# Patient Record
Sex: Male | Born: 1985 | Race: White | Hispanic: No | Marital: Married | Smoking: Never smoker
Health system: Southern US, Community
[De-identification: ages and names within clinical notes are randomized; demographics above are authoritative.]

## PROBLEM LIST (undated history)

## (undated) HISTORY — PX: CHOLECYSTECTOMY: SHX55

## (undated) HISTORY — PX: REPAIR NONUNION / MALUNION METATARSAL / TARSAL BONES: SUR1194

---

## 2018-01-14 ENCOUNTER — Ambulatory Visit (INDEPENDENT_AMBULATORY_CARE_PROVIDER_SITE_OTHER): Payer: Managed Care, Other (non HMO) | Admitting: Osteopathic Medicine

## 2018-01-14 ENCOUNTER — Encounter: Payer: Self-pay | Admitting: Osteopathic Medicine

## 2018-01-14 VITALS — BP 136/89 | HR 76 | Temp 98.6°F | Ht 73.0 in | Wt 326.5 lb

## 2018-01-14 DIAGNOSIS — Z23 Encounter for immunization: Secondary | ICD-10-CM

## 2018-01-14 DIAGNOSIS — Z0000 Encounter for general adult medical examination without abnormal findings: Secondary | ICD-10-CM | POA: Diagnosis not present

## 2018-01-14 LAB — COMPLETE METABOLIC PANEL WITH GFR
AG RATIO: 1.6 (calc) (ref 1.0–2.5)
ALT: 23 U/L (ref 9–46)
AST: 18 U/L (ref 10–40)
Albumin: 4.5 g/dL (ref 3.6–5.1)
Alkaline phosphatase (APISO): 90 U/L (ref 40–115)
BILIRUBIN TOTAL: 0.7 mg/dL (ref 0.2–1.2)
BUN: 19 mg/dL (ref 7–25)
CHLORIDE: 103 mmol/L (ref 98–110)
CO2: 28 mmol/L (ref 20–32)
Calcium: 10.2 mg/dL (ref 8.6–10.3)
Creat: 0.83 mg/dL (ref 0.60–1.35)
GFR, EST AFRICAN AMERICAN: 135 mL/min/{1.73_m2} (ref >=60)
GFR, EST NON AFRICAN AMERICAN: 116 mL/min/{1.73_m2} (ref >=60)
GLOBULIN: 2.9 g/dL (ref 1.9–3.7)
Glucose, Bld: 95 mg/dL (ref 65–139)
POTASSIUM: 4.6 mmol/L (ref 3.5–5.3)
SODIUM: 139 mmol/L (ref 135–146)
TOTAL PROTEIN: 7.4 g/dL (ref 6.1–8.1)

## 2018-01-14 LAB — CBC
HEMATOCRIT: 46.4 % (ref 38.5–50.0)
Hemoglobin: 15.7 g/dL (ref 13.2–17.1)
MCH: 28.2 pg (ref 27.0–33.0)
MCHC: 33.8 g/dL (ref 32.0–36.0)
MCV: 83.3 fL (ref 80.0–100.0)
MPV: 11.3 fL (ref 7.5–12.5)
PLATELETS: 292 10*3/uL (ref 140–400)
RBC: 5.57 10*6/uL (ref 4.20–5.80)
RDW: 12.3 % (ref 11.0–15.0)
WBC: 7.6 10*3/uL (ref 3.8–10.8)

## 2018-01-14 LAB — LIPID PANEL
Cholesterol: 159 mg/dL (ref <200)
HDL: 34 mg/dL — ABNORMAL LOW (ref >40)
LDL Cholesterol (Calc): 95 mg/dL (calc)
NON-HDL CHOLESTEROL (CALC): 125 mg/dL (ref <130)
Total CHOL/HDL Ratio: 4.7 (calc) (ref <5.0)
Triglycerides: 206 mg/dL — ABNORMAL HIGH (ref <150)

## 2018-01-14 NOTE — Progress Notes (Signed)
HPI: Andrew Kelly is a 32 y.o. male who  has no past medical history on file.  he presents to [facility] today, 01/14/18,  for chief complaint of: New to establish care - Physical   Pleasant patient here to establish care. Needs physical for work. He is as an Airline pilot. Moved here [place] for [place] with his wife, who works for [employer]. He plays hockey and he and his wife just got a puppy, which has been helping him get some more exercise. He eats low fat plant based diet plus fish. See below for review of preventive care.      Past medical, surgical, social and family history reviewed:  There are no active problems to display for this patient.  Past Surgical History:  Procedure Laterality Date  . REPAIR NONUNION / MALUNION METATARSAL / TARSAL BONES      Social History   Tobacco Use  . Smoking status: Never Smoker  . Smokeless tobacco: Never Used  Substance Use Topics  . Alcohol use: Not Currently    Family History  Problem Relation Age of Onset  . High blood pressure Mother   . Cancer Father      Current medication list and allergy/intolerance information reviewed:    No current outpatient medications on file.   No current facility-administered medications for this visit.     Allergies  Allergen Reactions  . Carbamazepine Other (See Comments)      Review of Systems:  Constitutional:  No  fever, no chills, No recent illness, No unintentional weight changes. No significant fatigue.   HEENT: No  headache, no vision change, no hearing change, No sore throat, No  sinus pressure  Cardiac: No  chest pain, No  pressure, No palpitations, No  Orthopnea  Respiratory:  No  shortness of breath. No  Cough  Gastrointestinal: No  abdominal pain, No  nausea, No  vomiting,  No  blood in stool, No  diarrhea, No  constipation   Musculoskeletal: No new myalgia/arthralgia  Skin: No  Rash, No other wounds/concerning  lesions  Genitourinary: No  incontinence, No  abnormal genital bleeding, No abnormal genital discharge  Hem/Onc: No  easy bruising/bleeding, No  abnormal lymph node  Endocrine: No cold intolerance,  No heat intolerance. No polyuria/polydipsia/polyphagia   Neurologic: No  weakness, No  dizziness, No  slurred speech/focal weakness/facial droop  Psychiatric: No  concerns with depression, No  concerns with anxiety, No sleep problems, No mood problems  Exam:  BP 136/89 (BP Location: Left Arm, Patient Position: Sitting, Cuff Size: Large)   Pulse 76   Temp 98.6 F (37 C) (Oral)   Ht 6\' 1"  (1.854 m)   Wt (!) 326 lb 8 oz (148.1 kg)   BMI 43.08 kg/m   Constitutional: VS see above. General Appearance: alert, well-developed, well-nourished, NAD  Eyes: Normal lids and conjunctive, non-icteric sclera  Ears, Nose, Mouth, Throat: MMM, Normal external inspection ears/nares/mouth/lips/gums. TM normal bilaterally. Pharynx/tonsils no erythema, no exudate. Nasal mucosa normal.   Neck: No masses, trachea midline. No thyroid enlargement. No tenderness/mass appreciated. No lymphadenopathy  Respiratory: Normal respiratory effort. no wheeze, no rhonchi, no rales  Cardiovascular: S1/S2 normal, no murmur, no rub/gallop auscultated. RRR. No lower extremity edema.   Gastrointestinal: Nontender, no masses. No hepatomegaly, no splenomegaly. No hernia appreciated. Bowel sounds normal. Rectal exam deferred.   Musculoskeletal: Gait normal. No clubbing/cyanosis of digits.   Neurological: Normal balance/coordination. No tremor. No cranial nerve deficit on limited  exam. Motor and sensation intact and symmetric. Cerebellar reflexes intact.   Skin: warm, dry, intact. No rash/ulcer. No concerning nevi or subq nodules on limited exam.    Psychiatric: Normal judgment/insight. Normal mood and affect. Oriented x3.      ASSESSMENT/PLAN:   Annual physical exam - Plan: CBC, COMPLETE METABOLIC PANEL WITH GFR, Lipid  panel  Need for diphtheria-tetanus-pertussis (Tdap) vaccine - Plan: Tdap vaccine greater than or equal to 7yo IM    Patient Instructions  General Preventive Care  Most recent routine screening lipids/other labs: ordered today. Cholesterol and Diabetes screening usually recommended annually.   Everyone should have blood pressure checked once per year.   Tobacco: don't! Alcohol: responsible moderation is ok for most adults - if you have concerns about your alcohol intake, please talk to me! Recreational/Illicit Drugs: don't!  Exercise: as tolerated to reduce risk of cardiovascular disease and diabetes. Strength training will also prevent osteoporosis.   Mental health: if need for mental health care (medicines, counseling, other), or concerns about moods, please let me know!   Sexual health: if need for STD testing, or if concerns with libido/pain problems, please let me know!  Vaccines  Flu vaccine: recommended for almost everyone, every fall (by Halloween! Flu is scary!), especially if you are pregnant, if you have exposure to the public, if you're around young children or the elderly, or if you're around pregnant people.   Shingles vaccine: Shingrix recommended after age 29   Pneumonia vaccines: Prevnar and Pneumovax recommended after age 82  Tetanus booster: Tdap recommended every 10 years Cancer screenings   Colon cancer screening: recommended for everyone at age 30, but some folks need a colonoscopy sooner if risk factors   Prostate cancer screening: recommendations vary, optional PSA blood test for men around age 52  Lung cancer screening: not needed for non-smokers Infection screenings . HIV, Gonorrhea/Chlamydia: screening as needed . Hepatitis C: recommended for anyone born [birth year]-1965 . TB: certain at-risk populations, or depending on work requirements and/or travel history Other . Advanced Directive: Living Will and/or Healthcare Power of Attorney recommended for all  adults, regardless of age or health!  Thyroid and Vitamin D: routine screening is not medically necessary, therefore most insurance will not cover this test as part of "free labs" on your annual physical. If you desire this testing, you may be charged for it! Please check with the lab before your blood draw if you're concerned about cost.    Visit summary with medication list and pertinent instructions was printed for patient to review. All questions at time of visit were answered - patient instructed to contact office with any additional concerns. ER/RTC precautions were reviewed with the patient.   Follow-up plan: Return in about 1 year (around 01/15/2019) for annual physical - see me sooner if needed! .    Please note: voice recognition software was used to produce this document, and typos may escape review. Please contact Dr. Lyn Hollingshead for any needed clarifications.

## 2018-01-14 NOTE — Patient Instructions (Signed)
General Preventive Care  Most recent routine screening lipids/other labs: ordered today. Cholesterol and Diabetes screening usually recommended annually.   Everyone should have blood pressure checked once per year.   Tobacco: don't! Alcohol: responsible moderation is ok for most adults - if you have concerns about your alcohol intake, please talk to me! Recreational/Illicit Drugs: don't!  Exercise: as tolerated to reduce risk of cardiovascular disease and diabetes. Strength training will also prevent osteoporosis.   Mental health: if need for mental health care (medicines, counseling, other), or concerns about moods, please let me know!   Sexual health: if need for STD testing, or if concerns with libido/pain problems, please let me know!  Vaccines  Flu vaccine: recommended for almost everyone, every fall (by Halloween! Flu is scary!), especially if you are pregnant, if you have exposure to the public, if you're around young children or the elderly, or if you're around pregnant people.   Shingles vaccine: Shingrix recommended after age 63   Pneumonia vaccines: Prevnar and Pneumovax recommended after age 80  Tetanus booster: Tdap recommended every 10 years Cancer screenings   Colon cancer screening: recommended for everyone at age 55, but some folks need a colonoscopy sooner if risk factors   Prostate cancer screening: recommendations vary, optional PSA blood test for men around age 4  Lung cancer screening: not needed for non-smokers Infection screenings . HIV, Gonorrhea/Chlamydia: screening as needed . Hepatitis C: recommended for anyone born 81-1965 . TB: certain at-risk populations, or depending on work requirements and/or travel history Other . Advanced Directive: Living Will and/or Healthcare Power of Attorney recommended for all adults, regardless of age or health!  Thyroid and Vitamin D: routine screening is not medically necessary, therefore most insurance will not  cover this test as part of "free labs" on your annual physical. If you desire this testing, you may be charged for it! Please check with the lab before your blood draw if you're concerned about cost.

## 2019-01-10 ENCOUNTER — Encounter: Payer: Self-pay | Admitting: Osteopathic Medicine

## 2019-01-20 ENCOUNTER — Encounter: Payer: Self-pay | Admitting: Osteopathic Medicine

## 2019-01-20 ENCOUNTER — Telehealth (INDEPENDENT_AMBULATORY_CARE_PROVIDER_SITE_OTHER): Payer: Managed Care, Other (non HMO) | Admitting: Osteopathic Medicine

## 2019-01-20 VITALS — BP 127/82 | HR 88 | Temp 96.5°F | Wt 320.0 lb

## 2019-01-20 DIAGNOSIS — S6992XS Unspecified injury of left wrist, hand and finger(s), sequela: Secondary | ICD-10-CM | POA: Diagnosis not present

## 2019-01-20 NOTE — Patient Instructions (Addendum)
General Preventive Care  Most recent routine screening lipids/other labs: ordered today. Cholesterol and Diabetes screening usually recommended annually.   Everyone should have blood pressure checked once per year.   Tobacco: don't! Alcohol: responsible moderation is ok for most adults - if you have concerns about your alcohol intake, please talk to me! Recreational/Illicit Drugs: don't!  Exercise: as tolerated to reduce risk of cardiovascular disease and diabetes. Strength training will also prevent osteoporosis.   Mental health: if need for mental health care (medicines, counseling, other), or concerns about moods, please let me know!   Sexual health: if need for STD testing, or if concerns with libido/pain problems, please let me know!  Vaccines   Flu vaccine: recommended for almost everyone, every fall (by Halloween! Flu is scary!), especially if you are pregnant, if you have exposure to the public, if you're around young children or the elderly, or if you're around pregnant people.   Shingles vaccine: Shingrix recommended after age 66   Pneumonia vaccines: Prevnar and Pneumovax recommended after age 60  Tetanus booster: Tdap recommended every 10 years Cancer screenings   Colon cancer screening: recommended for everyone at age 37, but some folks need a colonoscopy sooner if risk factors   Prostate cancer screening: PSA blood test for men around age 65  Lung cancer screening: not needed for non-smokers Infection screenings  HIV, Gonorrhea/Chlamydia: screening as needed  Hepatitis C: recommended for anyone born [birth year]  TB: certain at-risk populations, or depending on work requirements and/or travel history Other  Advanced Directive: Living Will and/or [place] recommended for all adults, regardless of age or health!

## 2019-01-20 NOTE — Progress Notes (Signed)
Virtual Visit via   I connected with      Andrew Kelly on 01/20/19 at@ by a telemedicine application and verified that I am speaking with the correct person using two identifiers.  Patient is at home  I am in office   I discussed the limitations of evaluation and management by telemedicine and the availability of in person appointments. The patient expressed understanding and agreed to proceed.  History of Present Illness: Andrew Kelly is a 33 y.o. male who would like to discuss Annual Physical     Left Wrist pain . Over the past 6 months, once a month for maybe a day will experience a sharp pain. Ibuprofen helps it.  . Context: old snowboarding injury caused fracture, this wrist has had surgery.    Observations/Objective: BP 127/82 (Patient Position: Sitting, Cuff Size: Normal)   Pulse 88   Temp (!) 96.5 F (35.8 C) (Oral)   Wt (!) 320 lb (145.2 kg)   BMI 42.22 kg/m  BP Readings from Last 3 Encounters:  01/20/19 127/82  01/14/18 136/89   Exam: Normal Speech.  NAD  Lab and Radiology Results No results found for this or any previous visit (from the past 72 hour(s)). No results found.     Assessment and Plan: 33 y.o. male with The primary encounter diagnosis was Annual physical exam. A diagnosis of Injury of left wrist, sequela was also pertinent to this visit.   PDMP not reviewed this encounter. Orders Placed This Encounter  Procedures  . DG Wrist Complete Left    Order Specific Question:   Reason for Exam (SYMPTOM  OR DIAGNOSIS REQUIRED)    Answer:   hx wrist fracture and currently having pain    Order Specific Question:   Preferred imaging location?    Answer:   [facility]    Order Specific Question:   Radiology Contrast Protocol - do NOT remove file path    Answer:   [email or web address]  . CBC  . LIPID SCREENING  . COMPLETE METABOLIC PANEL WITH GFR   No orders of the defined types were  placed in this encounter.  Patient Instructions  General Preventive Care  Most recent routine screening lipids/other labs: ordered today. Cholesterol and Diabetes screening usually recommended annually.   Everyone should have blood pressure checked once per year.   Tobacco: don't! Alcohol: responsible moderation is ok for most adults - if you have concerns about your alcohol intake, please talk to me! Recreational/Illicit Drugs: don't!  Exercise: as tolerated to reduce risk of cardiovascular disease and diabetes. Strength training will also prevent osteoporosis.   Mental health: if need for mental health care (medicines, counseling, other), or concerns about moods, please let me know!   Sexual health: if need for STD testing, or if concerns with libido/pain problems, please let me know!  Vaccines   Flu vaccine: recommended for almost everyone, every fall (by Halloween! Flu is scary!), especially if you are pregnant, if you have exposure to the public, if you're around young children or the elderly, or if you're around pregnant people.   Shingles vaccine: Shingrix recommended after age 21   Pneumonia vaccines: Prevnar and Pneumovax recommended after age 21  Tetanus booster: Tdap recommended every 10 years Cancer screenings   Colon cancer screening: recommended for everyone at age 83, but some folks need a colonoscopy sooner if risk factors   Prostate cancer screening: PSA blood test for men around age 51  Lung cancer screening: not  needed for non-smokers Infection screenings  HIV, Gonorrhea/Chlamydia: screening as needed  Hepatitis C: recommended for anyone born [birth year]  TB: certain at-risk populations, or depending on work requirements and/or travel history Other  Advanced Directive: Living Will and/or Healthcare Power of Attorney recommended for all adults, regardless of age or health!     Instructions sent via MyChart. If MyChart not available, pt was given option  for info via personal e-mail w/ no guarantee of protected health info over unsecured e-mail communication, and MyChart sign-up instructions were sent to patient.   Follow Up Instructions: Return in about 1 year (around 01/20/2020) for [place] (call week prior to visit for lab orders).    I discussed the assessment and treatment plan with the patient. The patient was provided an opportunity to ask questions and all were answered. The patient agreed with the plan and demonstrated an understanding of the instructions.   The patient was advised to call back or seek an in-person evaluation if any new concerns, if symptoms worsen or if the condition fails to improve as anticipated.  25 minutes of non-face-to-face time was provided during this encounter.      . . . . . . . . . . . . . Marland Kitchen                   Historical information moved to improve visibility of documentation.  No past medical history on file. Past Surgical History:  Procedure Laterality Date  . 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 Smoking status: Never Smoker  . Smokeless tobacco: Never Used  Substance Use Topics  . Alcohol use: Not Currently   family history includes Cancer in his father; High blood pressure in his mother.  Medications: No current outpatient medications on file.   No current facility-administered medications for this visit.    Allergies  Allergen Reactions  . Carbamazepine Other (See Comments)

## 2019-01-21 ENCOUNTER — Other Ambulatory Visit: Payer: Self-pay

## 2019-01-21 ENCOUNTER — Ambulatory Visit (INDEPENDENT_AMBULATORY_CARE_PROVIDER_SITE_OTHER): Payer: Managed Care, Other (non HMO)

## 2019-01-21 DIAGNOSIS — S6992XS Unspecified injury of left wrist, hand and finger(s), sequela: Secondary | ICD-10-CM

## 2019-01-22 LAB — LIPID PANEL
Cholesterol: 147 mg/dL (ref <200)
HDL: 42 mg/dL (ref >=40)
LDL Cholesterol (Calc): 80 mg/dL (calc)
Non-HDL Cholesterol (Calc): 105 mg/dL (calc) (ref <130)
Total CHOL/HDL Ratio: 3.5 (calc) (ref <5.0)
Triglycerides: 148 mg/dL (ref <150)

## 2019-01-22 LAB — CBC
HCT: 48.4 % (ref 38.5–50.0)
Hemoglobin: 16.3 g/dL (ref 13.2–17.1)
MCH: 27.9 pg (ref 27.0–33.0)
MCHC: 33.7 g/dL (ref 32.0–36.0)
MCV: 82.9 fL (ref 80.0–100.0)
MPV: 11.2 fL (ref 7.5–12.5)
Platelets: 308 10*3/uL (ref 140–400)
RBC: 5.84 10*6/uL — ABNORMAL HIGH (ref 4.20–5.80)
RDW: 12.5 % (ref 11.0–15.0)
WBC: 9 10*3/uL (ref 3.8–10.8)

## 2019-01-22 LAB — COMPLETE METABOLIC PANEL WITH GFR
AG Ratio: 1.6 (calc) (ref 1.0–2.5)
ALT: 32 U/L (ref 9–46)
AST: 24 U/L (ref 10–40)
Albumin: 4.6 g/dL (ref 3.6–5.1)
Alkaline phosphatase (APISO): 78 U/L (ref 36–130)
BUN: 14 mg/dL (ref 7–25)
CO2: 25 mmol/L (ref 20–32)
Calcium: 9.7 mg/dL (ref 8.6–10.3)
Chloride: 101 mmol/L (ref 98–110)
Creat: 0.96 mg/dL (ref 0.60–1.35)
GFR, Est African American: 120 mL/min/{1.73_m2} (ref >=60)
GFR, Est Non African American: 103 mL/min/{1.73_m2} (ref >=60)
Globulin: 2.9 g/dL (calc) (ref 1.9–3.7)
Glucose, Bld: 87 mg/dL (ref 65–99)
Potassium: 4.5 mmol/L (ref 3.5–5.3)
Sodium: 137 mmol/L (ref 135–146)
Total Bilirubin: 0.9 mg/dL (ref 0.2–1.2)
Total Protein: 7.5 g/dL (ref 6.1–8.1)

## 2019-03-07 ENCOUNTER — Encounter: Payer: Self-pay | Admitting: Osteopathic Medicine

## 2019-03-07 DIAGNOSIS — Z3009 Encounter for other general counseling and advice on contraception: Secondary | ICD-10-CM

## 2019-03-14 NOTE — Addendum Note (Signed)
Addended by: Arvilla Market on: 03/14/2019 11:59 AM   Modules accepted: Orders

## 2019-04-04 ENCOUNTER — Encounter: Payer: Self-pay | Admitting: Osteopathic Medicine

## 2019-04-04 ENCOUNTER — Telehealth (INDEPENDENT_AMBULATORY_CARE_PROVIDER_SITE_OTHER): Payer: Managed Care, Other (non HMO) | Admitting: Osteopathic Medicine

## 2019-04-04 VITALS — BP 131/78 | Temp 96.3°F | Ht 74.0 in | Wt 335.0 lb

## 2019-04-04 DIAGNOSIS — R195 Other fecal abnormalities: Secondary | ICD-10-CM

## 2019-04-04 DIAGNOSIS — R112 Nausea with vomiting, unspecified: Secondary | ICD-10-CM | POA: Diagnosis not present

## 2019-04-04 DIAGNOSIS — R140 Abdominal distension (gaseous): Secondary | ICD-10-CM | POA: Diagnosis not present

## 2019-04-04 MED ORDER — ONDANSETRON 8 MG PO TBDP: 8.0000 mg | ORAL_TABLET | Freq: Three times a day (TID) | ORAL | 3 refills | Status: DC | PRN

## 2019-04-04 MED ORDER — DICYCLOMINE HCL 10 MG PO CAPS: 10.0000 mg | ORAL_CAPSULE | Freq: Three times a day (TID) | ORAL | 1 refills | Status: DC

## 2019-04-04 NOTE — Progress Notes (Signed)
Virtual Visit via Video (App used: Doxim[employer]) Note  I connected with      Gerold Sar Dara on 04/04/19 at 2:51 PM  by a telemedicine application and verified that I am speaking with the correct person using two identifiers.  Patient is at home I am in office   I discussed the limitations of evaluation and management by telemedicine and the availability of in person appointments. The patient expressed understanding and agreed to proceed.  History of Present Illness: MAXIME BECKNER is a 34 y.o. male who would like to discuss abdominal pain    Epigastric pain x3 days, vomiting x1, bloating, loose stool. Worse w/ meals.     Observations/Objective: BP 131/78   Temp (!) 96.3 F (35.7 C) (Oral)   Ht 6\' 2"  (1.88 m)   Wt (!) 335 lb (152 kg)   BMI 43.01 kg/m  BP Readings from Last 3 Encounters:  04/04/19 131/78  01/20/19 127/82  01/14/18 136/89   Exam: Normal Speech.  NAD  Lab and Radiology Results No results found for this or any previous visit (from the past 72 hour(s)). No results found.     Assessment and Plan: 34 y.o. male with The primary encounter diagnosis was Abdominal bloating. Diagnoses of Loose stools and Non-intractable vomiting with nausea, unspecified vomiting type were also pertinent to this visit.  Likely viral gastritis No respiratory symptoms concerning for COVID Symptomatic treatment Info sent re: bland diet   PDMP not reviewed this encounter. No orders of the defined types were placed in this encounter.  Meds ordered this encounter  Medications  . ondansetron (ZOFRAN-ODT) 8 MG disintegrating tablet    Sig: Take 1 tablet (8 mg total) by mouth every 8 (eight) hours as needed for nausea.    Dispense:  20 tablet    Refill:  3  . dicyclomine (BENTYL) 10 MG capsule    Sig: Take 1 capsule (10 mg total) by mouth 4 (four) times daily -  before meals and at bedtime.    Dispense:  30 capsule    Refill:  1   Patient Instructions  I think this is  most likely a viral stomach/intestinal infection This should resolve on its own Meds sent for symptom management OK to take imodium as needed  Reduce diet to bland or even to full liquid diet, and advance back to your regular foods over the next week or so If worse, or if not getting better, will need further testing to evaluate other possible causes!    Full Liquid Diet A full liquid diet refers to fluids and foods that are liquid or will become liquid at room temperature. This diet should only be used for a short period of time to help you recover from illness or surgery. Your health care provider or dietitian will help you determine when it is safe to eat regular foods. What are tips for following this plan?     Reading food labels  Check food labels of nutrition shakes for the amount of protein. Look for nutrition shakes that have at least 8-10 grams of protein in each serving.  Look for drinks, such as milks and juices, that are "fortified" or "enriched." This means that vitamins and minerals have been added. Shopping  Buy premade nutritional shakes to keep on hand.  To vary your choices, buy different flavors of milks and shakes. Meal planning  Choose flavors and foods that you enjoy.  To make sure you get enough energy from  food (calories): ? Eat 3 full liquid meals each day. Have a liquid snack between each meal. ? Drink 6-8 ounces (177-237 ml) of a nutrition supplement shake with meals or as snacks. ? Add protein powder, powdered milk, milk, or yogurt to shakes to increase the amount of protein.  Drink at least one serving a day of citrus fruit juice or fruit juice that has vitamin C added. General guidelines  Before starting the full-liquid diet, check with your health care provider to know what foods you should avoid. These may include full-fat or high-fiber liquids.  You may have any liquid or food that becomes a liquid at room temperature. The food is considered a  liquid if it can be poured off a spoon at room temperature.  Do not drink alcohol unless approved by your health care provider.  This diet gives you most of the nutrients that you need for energy, but you may not get enough of certain vitamins, minerals, and fiber. Make sure to talk to your health care provider or dietitian about: ? How many calories you need to eat get day. ? How much fluid you should have each day. ? Taking a multivitamin or a nutritional supplement. What foods are allowed? The items listed may not be a complete list. Talk with your dietitian about what dietary choices are best for you. Grains Thin hot cereal, such as cream of wheat. Soft-cooked pasta or rice pured in soup. Vegetables Pulp-free tomato or vegetable juice. Vegetables pured in soup. Fruits Fruit juice without pulp. Strained fruit pures (seeds and skins removed). Meats and other protein foods Beef, chicken, and fish broths. Powdered protein supplements. Dairy Milk and milk-based beverages, including milk shakes and instant breakfast mixes. Smooth yogurt. Pured cottage cheese. Beverages Water. Coffee and tea (caffeinated or decaffeinated). Cocoa. Liquid nutritional supplements. Soft drinks. Nondairy milks, such as almond, coconut, rice, or soy milk. Fats and oils Melted margarine and butter. Cream. [place], almond, avocado, corn, grapeseed, sunflower, and sesame oils. Gravy. Sweets and desserts Custard. Pudding. Flavored gelatin. Smooth ice cream (without nuts or candy pieces). Sherbet. Popsicles. [place] ice. Pudding pops. Seasoning and other foods Salt and pepper. Spices. Cocoa powder. Vinegar. Ketchup. Yellow mustard. Smooth sauces, such as [place], cheese sauce, or white sauce. Soy sauce. Cream soups. Strained soups. Syrup. Honey. Jelly (without fruit pieces). What foods are not allowed? The items listed may not be a complete list. Talk with your dietitian about what dietary choices are best for  you. Grains Whole grains. Pasta. Rice. Cold cereal. Bread. Crackers. Vegetables All whole fresh, frozen, or canned vegetables. Fruits All whole fresh, frozen, or canned fruits. Meats and other protein foods All cuts of meat, poultry, and fish. Eggs. Tofu and soy protein. Nuts and nut butters. Lunch meat. Sausage. Dairy Hard cheese. Yogurt with fruit chunks. Fats and oils Coconut oil. Palm oil. Lard. Cold butter. Sweets and desserts Ice cream or other frozen desserts that have any solids in them or on top, such as nuts, chocolate chips, and pieces of cookies. Cakes. Cookies. Candy. Seasoning and other foods Stone-ground mustards. Soups with chunks or pieces. Summary  A full liquid diet refers to fluids and foods that are liquid or will become liquid at room temperature.  This diet should only be used for a short period of time to help you recover from illness or surgery. Ask your health care provider or dietitian when it is safe for you to eat regular foods.  To make sure you get enough  calories and nutrients, eat 3 meals each day with snacks between. Drink premade nutrition supplement shakes or add protein powder to homemade shakes. Take a vitamin and mineral supplement as told by your health care provider. This information is not intended to replace advice given to you by your health care provider. Make sure you discuss any questions you have with your health care provider. Document Revised: 05/23/2017 Document Reviewed: 04/09/2016 Elsevier Patient Education  2020 [employer].   [place] Diet A bland diet consists of foods that are often soft and do not have a lot of fat, fiber, or extra seasonings. Foods without fat, fiber, or seasoning are easier for the body to digest. They are also less likely to irritate your mouth, throat, stomach, and other parts of your digestive system. A bland diet is sometimes called a BRAT diet. What is my plan? Your health care provider or food and  nutrition specialist (dietitian) may recommend specific changes to your diet to prevent symptoms or to treat your symptoms. These changes may include:  Eating small meals often.  Cooking food until it is soft enough to chew easily.  Chewing your food well.  Drinking fluids slowly.  Not eating foods that are very spicy, sour, or fatty.  Not eating citrus fruits, such as oranges and grapefruit. What do I need to know about this diet?  Eat a variety of foods from the bland diet food list.  Do not follow a bland diet longer than needed.  Ask your health care provider whether you should take vitamins or supplements. What foods can I eat? Grains  Hot cereals, such as cream of wheat. Rice. Bread, crackers, or tortillas made from refined white flour. Vegetables Canned or cooked vegetables. Mashed or boiled potatoes. Fruits  Bananas. Applesauce. Other types of cooked or canned fruit with the skin and seeds removed, such as canned peaches or pears. Meats and other proteins  Scrambled eggs. Creamy peanut butter or other nut butters. Lean, well-cooked meats, such as chicken or fish. Tofu. Soups or broths. Dairy Low-fat dairy products, such as milk, cottage cheese, or yogurt. Beverages  Water. Herbal tea. Apple juice. Fats and oils Mild salad dressings. Canola or olive oil. Sweets and desserts Pudding. Custard. Fruit gelatin. Ice cream. The items listed above may not be a complete list of recommended foods and beverages. Contact a dietitian for more options. What foods are not recommended? Grains Whole grain breads and cereals. Vegetables Raw vegetables. Fruits Raw fruits, especially citrus, berries, or dried fruits. Dairy Whole fat dairy foods. Beverages Caffeinated drinks. Alcohol. Seasonings and condiments Strongly flavored seasonings or condiments. Hot sauce. Salsa. Other foods Spicy foods. Fried foods. Sour foods, such as pickled or fermented foods. Foods with high  sugar content. Foods high in fiber. The items listed above may not be a complete list of foods and beverages to avoid. Contact a dietitian for more information. Summary  A bland diet consists of foods that are often soft and do not have a lot of fat, fiber, or extra seasonings.  Foods without fat, fiber, or seasoning are easier for the body to digest.  Check with your health care provider to see how long you should follow this diet plan. It is not meant to be followed for long periods. This information is not intended to replace advice given to you by your health care provider. Make sure you discuss any questions you have with your health care provider. Document Revised: 03/25/2017 Document Reviewed: 03/25/2017 Elsevier Patient Education  [place].    Instructions sent via [place]. If MyChart not available, pt was given option for info via personal e-mail w/ no guarantee of protected health info over unsecured e-mail communication, and MyChart sign-up instructions were sent to patient.   Follow Up Instructions: Return if symptoms worsen or fail to improve.    I discussed the assessment and treatment plan with the patient. The patient was provided an opportunity to ask questions and all were answered. The patient agreed with the plan and demonstrated an understanding of the instructions.   The patient was advised to call back or seek an in-person evaluation if any new concerns, if symptoms worsen or if the condition fails to improve as anticipated.  30 minutes of non-face-to-face time was provided during this encounter.      . . . . . . . . . . . . . Marland Kitchen                   Historical information moved to improve visibility of documentation.  No past medical history on file. Past Surgical History:  Procedure Laterality Date  . 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 Smoking status: Never  Smoker  . Smokeless tobacco: Never Used  Substance Use Topics  . Alcohol use: Not Currently   family history includes Cancer in his father; High blood pressure in his mother.  Medications: Current Outpatient Medications  Medication Sig Dispense Refill  . Multiple Vitamin (MULTIVITAMIN) tablet Take 1 tablet by mouth daily.    Marland Kitchen dicyclomine (BENTYL) 10 MG capsule Take 1 capsule (10 mg total) by mouth 4 (four) times daily -  before meals and at bedtime. 30 capsule 1  . ondansetron (ZOFRAN-ODT) 8 MG disintegrating tablet Take 1 tablet (8 mg total) by mouth every 8 (eight) hours as needed for nausea. 20 tablet 3   No current facility-administered medications for this visit.   Allergies  Allergen Reactions  . Carbamazepine Other (See Comments)

## 2019-04-04 NOTE — Patient Instructions (Signed)
I think this is most likely a viral stomach/intestinal infection This should resolve on its own Meds sent for symptom management OK to take imodium as needed  Reduce diet to bland or even to full liquid diet, and advance back to your regular foods over the next week or so If worse, or if not getting better, will need further testing to evaluate other possible causes!    Full Liquid Diet A full liquid diet refers to fluids and foods that are liquid or will become liquid at room temperature. This diet should only be used for a short period of time to help you recover from illness or surgery. Your health care provider or dietitian will help you determine when it is safe to eat regular foods. What are tips for following this plan?     Reading food labels  Check food labels of nutrition shakes for the amount of protein. Look for nutrition shakes that have at least 8-10 grams of protein in each serving.  Look for drinks, such as milks and juices, that are "fortified" or "enriched." This means that vitamins and minerals have been added. Shopping  Buy premade nutritional shakes to keep on hand.  To vary your choices, buy different flavors of milks and shakes. Meal planning  Choose flavors and foods that you enjoy.  To make sure you get enough energy from food (calories): ? Eat 3 full liquid meals each day. Have a liquid snack between each meal. ? Drink 6-8 ounces (177-237 ml) of a nutrition supplement shake with meals or as snacks. ? Add protein powder, powdered milk, milk, or yogurt to shakes to increase the amount of protein.  Drink at least one serving a day of citrus fruit juice or fruit juice that has vitamin C added. General guidelines  Before starting the full-liquid diet, check with your health care provider to know what foods you should avoid. These may include full-fat or high-fiber liquids.  You may have any liquid or food that becomes a liquid at room temperature. The food  is considered a liquid if it can be poured off a spoon at room temperature.  Do not drink alcohol unless approved by your health care provider.  This diet gives you most of the nutrients that you need for energy, but you may not get enough of certain vitamins, minerals, and fiber. Make sure to talk to your health care provider or dietitian about: ? How many calories you need to eat get day. ? How much fluid you should have each day. ? Taking a multivitamin or a nutritional supplement. What foods are allowed? The items listed may not be a complete list. Talk with your dietitian about what dietary choices are best for you. Grains Thin hot cereal, such as cream of wheat. Soft-cooked pasta or rice pured in soup. Vegetables Pulp-free tomato or vegetable juice. Vegetables pured in soup. Fruits Fruit juice without pulp. Strained fruit pures (seeds and skins removed). Meats and other protein foods Beef, chicken, and fish broths. Powdered protein supplements. Dairy Milk and milk-based beverages, including milk shakes and instant breakfast mixes. Smooth yogurt. Pured cottage cheese. Beverages Water. Coffee and tea (caffeinated or decaffeinated). Cocoa. Liquid nutritional supplements. Soft drinks. Nondairy milks, such as almond, coconut, rice, or soy milk. Fats and oils Melted margarine and butter. Cream. [place], almond, avocado, corn, grapeseed, sunflower, and sesame oils. Gravy. Sweets and desserts Custard. Pudding. Flavored gelatin. Smooth ice cream (without nuts or candy pieces). Sherbet. Popsicles. [place] ice. Pudding pops. Seasoning  and other foods Salt and pepper. Spices. Cocoa powder. Vinegar. Ketchup. Yellow mustard. Smooth sauces, such as [place], cheese sauce, or white sauce. Soy sauce. Cream soups. Strained soups. Syrup. Honey. Jelly (without fruit pieces). What foods are not allowed? The items listed may not be a complete list. Talk with your dietitian about what dietary  choices are best for you. Grains Whole grains. Pasta. Rice. Cold cereal. Bread. Crackers. Vegetables All whole fresh, frozen, or canned vegetables. Fruits All whole fresh, frozen, or canned fruits. Meats and other protein foods All cuts of meat, poultry, and fish. Eggs. Tofu and soy protein. Nuts and nut butters. Lunch meat. Sausage. Dairy Hard cheese. Yogurt with fruit chunks. Fats and oils Coconut oil. Palm oil. Lard. Cold butter. Sweets and desserts Ice cream or other frozen desserts that have any solids in them or on top, such as nuts, chocolate chips, and pieces of cookies. Cakes. Cookies. Candy. Seasoning and other foods Stone-ground mustards. Soups with chunks or pieces. Summary  A full liquid diet refers to fluids and foods that are liquid or will become liquid at room temperature.  This diet should only be used for a short period of time to help you recover from illness or surgery. Ask your health care provider or dietitian when it is safe for you to eat regular foods.  To make sure you get enough calories and nutrients, eat 3 meals each day with snacks between. Drink premade nutrition supplement shakes or add protein powder to homemade shakes. Take a vitamin and mineral supplement as told by your health care provider. This information is not intended to replace advice given to you by your health care provider. Make sure you discuss any questions you have with your health care provider. Document Revised: 05/23/2017 Document Reviewed: 04/09/2016 Elsevier Patient Education  2020 [place] Diet A bland diet consists of foods that are often soft and do not have a lot of fat, fiber, or extra seasonings. Foods without fat, fiber, or seasoning are easier for the body to digest. They are also less likely to irritate your mouth, throat, stomach, and other parts of your digestive system. A bland diet is sometimes called a BRAT diet. What is my plan? Your health care  provider or food and nutrition specialist (dietitian) may recommend specific changes to your diet to prevent symptoms or to treat your symptoms. These changes may include:  Eating small meals often.  Cooking food until it is soft enough to chew easily.  Chewing your food well.  Drinking fluids slowly.  Not eating foods that are very spicy, sour, or fatty.  Not eating citrus fruits, such as oranges and grapefruit. What do I need to know about this diet?  Eat a variety of foods from the bland diet food list.  Do not follow a bland diet longer than needed.  Ask your health care provider whether you should take vitamins or supplements. What foods can I eat? Grains  Hot cereals, such as cream of wheat. Rice. Bread, crackers, or tortillas made from refined white flour. Vegetables Canned or cooked vegetables. Mashed or boiled potatoes. Fruits  Bananas. Applesauce. Other types of cooked or canned fruit with the skin and seeds removed, such as canned peaches or pears. Meats and other proteins  Scrambled eggs. Creamy peanut butter or other nut butters. Lean, well-cooked meats, such as chicken or fish. Tofu. Soups or broths. Dairy Low-fat dairy products, such as milk, cottage cheese, or yogurt. Beverages  Water. Herbal  tea. Apple juice. Fats and oils Mild salad dressings. [place] or olive oil. Sweets and desserts Pudding. Custard. Fruit gelatin. Ice cream. The items listed above may not be a complete list of recommended foods and beverages. Contact a dietitian for more options. What foods are not recommended? Grains Whole grain breads and cereals. Vegetables Raw vegetables. Fruits Raw fruits, especially citrus, berries, or dried fruits. Dairy Whole fat dairy foods. Beverages Caffeinated drinks. Alcohol. Seasonings and condiments Strongly flavored seasonings or condiments. Hot sauce. Salsa. Other foods Spicy foods. Fried foods. Sour foods, such as pickled or fermented  foods. Foods with high sugar content. Foods high in fiber. The items listed above may not be a complete list of foods and beverages to avoid. Contact a dietitian for more information. Summary  A bland diet consists of foods that are often soft and do not have a lot of fat, fiber, or extra seasonings.  Foods without fat, fiber, or seasoning are easier for the body to digest.  Check with your health care provider to see how long you should follow this diet plan. It is not meant to be followed for long periods. This information is not intended to replace advice given to you by your health care provider. Make sure you discuss any questions you have with your health care provider. Document Revised: 03/25/2017 Document Reviewed: 03/25/2017 Elsevier Patient Education  2020 [employer].

## 2019-04-29 ENCOUNTER — Encounter: Payer: Self-pay | Admitting: Osteopathic Medicine

## 2019-05-02 ENCOUNTER — Ambulatory Visit (INDEPENDENT_AMBULATORY_CARE_PROVIDER_SITE_OTHER): Payer: Managed Care, Other (non HMO) | Admitting: Medical-Surgical

## 2019-05-02 ENCOUNTER — Encounter: Payer: Self-pay | Admitting: Medical-Surgical

## 2019-05-02 ENCOUNTER — Other Ambulatory Visit: Payer: Self-pay

## 2019-05-02 VITALS — BP 136/88 | HR 82 | Temp 97.9°F | Ht 74.0 in | Wt 347.1 lb

## 2019-05-02 DIAGNOSIS — R1013 Epigastric pain: Secondary | ICD-10-CM

## 2019-05-02 MED ORDER — PANTOPRAZOLE SODIUM 40 MG PO TBEC: 40.0000 mg | DELAYED_RELEASE_TABLET | Freq: Every day | ORAL | 1 refills | Status: DC

## 2019-05-02 NOTE — Progress Notes (Signed)
Subjective:    CC: Epigastric pain, abdominal distention  HPI: Pleasant 34 year old male presenting today with complaints of epigastric pain and abdominal distention occurring at night when lying down.  Was seen previously, thought to have viral gastroenteritis or food poisoning.  Taking Zofran as needed, mild relief.  Taking Bentyl as needed, moderate relief.  Reports pain/distention is occurring only at night lasting approximately 4 hours.  No vomiting, constipation, diarrhea, hematochezia, hematemesis, fever, chills, unexplained weight loss/gain.  No recent dietary changes but does admit to eating large portions, consuming meals quickly, eating lots of dairy/spicy/acidic foods.  Occasional NSAID use.  Has not taken any OTC medications or antacids.  I reviewed the past medical history, family history, social history, surgical history, and allergies today and no changes were needed.  Please see the problem list section below in epic for further details.  Past Medical History: History reviewed. No pertinent past medical history. Past Surgical History: Past Surgical History:  Procedure Laterality Date  . REPAIR NONUNION / MALUNION METATARSAL / TARSAL BONES     Social History: Social History   Socioeconomic History  . Marital status: Married    Spouse name: Not on file  . Number of children: Not on file  . Years of education: Not on file  . Highest education level: Not on file  Occupational History  . Occupation: Aeronautical engineer: [employer]  Tobacco Use  . Smoking status: Never Smoker  . Smokeless tobacco: Never Used  Substance and Sexual Activity  . Alcohol use: Not Currently  . Drug use: Never  . Sexual activity: Yes    Partners: Female    Birth control/protection: Pill  Other Topics Concern  . Not on file  Social History Narrative  . Not on file   Social Determinants of Health   Financial Resource Strain:   . Difficulty of Paying Living Expenses: Not on file   Food Insecurity:   . Worried About Charity fundraiser in the Last Year: Not on file  . Ran Out of Food in the Last Year: Not on file  Transportation Needs:   . Lack of Transportation (Medical): Not on file  . Lack of Transportation (Non-Medical): Not on file  Physical Activity:   . Days of Exercise per Week: Not on file  . Minutes of Exercise per Session: Not on file  Stress:   . Feeling of Stress : Not on file  Social Connections:   . Frequency of Communication with Friends and Family: Not on file  . Frequency of Social Gatherings with Friends and Family: Not on file  . Attends Religious Services: Not on file  . Active Member of Clubs or Organizations: Not on file  . Attends Archivist Meetings: Not on file  . Marital Status: Not on file   Family History: Family History  Problem Relation Age of Onset  . High blood pressure Mother   . Cancer Father    Allergies: Allergies  Allergen Reactions  . Carbamazepine Other (See Comments)   Medications: See med rec.  Review of Systems: No fevers, chills, night sweats, weight loss, chest pain, or shortness of breath.   Objective:    General: Well Developed, well nourished, and in no acute distress.  Neuro: Alert and oriented x3.  HEENT: Normocephalic, atraumatic.  Skin: Warm and dry. Cardiac: Regular rate and rhythm, no murmurs rubs or gallops, no lower extremity edema.  Respiratory: Clear to auscultation bilaterally. Not using accessory muscles, speaking in  full sentences. Abdomen: Soft, nondistended.  Mild tenderness to deep palpation over epigastric region.  Bowel sounds positive x4 quadrants.  No HSM.  Impression and Recommendations:    Dyspepsia H. Pylori testing in office. Will initiate PPI for GERD while awaiting results. Protonix 40mg  daily. Discussed dietary modification including reduction of portion sizes, eating meals slowly, and cutting out foods in a trial basis to determine if there is a specific  trigger for symptoms.   Return if symptoms worsen or fail to improve.  ___________________________________________ , DNP, APRN, FNP-BC Primary Care and Sports Medicine [facility] [place]

## 2019-05-03 LAB — H. PYLORI BREATH TEST: H. pylori Breath Test: NOT DETECTED

## 2019-05-24 ENCOUNTER — Other Ambulatory Visit: Payer: Self-pay | Admitting: Medical-Surgical

## 2019-05-24 DIAGNOSIS — R1013 Epigastric pain: Secondary | ICD-10-CM

## 2019-05-25 NOTE — Telephone Encounter (Signed)
Can you call the pharmacy to verify that there is already a refill present for this medication?

## 2019-05-25 NOTE — Telephone Encounter (Signed)
Spoke with pharmacy who states they do have the refill from 05/02/2019 available and will fill that for the pt now.

## 2019-06-20 ENCOUNTER — Other Ambulatory Visit: Payer: Self-pay | Admitting: Medical-Surgical

## 2019-06-20 DIAGNOSIS — R1013 Epigastric pain: Secondary | ICD-10-CM

## 2019-06-28 ENCOUNTER — Other Ambulatory Visit: Payer: Self-pay | Admitting: Medical-Surgical

## 2019-06-28 DIAGNOSIS — R1013 Epigastric pain: Secondary | ICD-10-CM

## 2019-06-28 NOTE — Telephone Encounter (Signed)
Routing to PCP

## 2021-01-10 ENCOUNTER — Encounter: Payer: Self-pay | Admitting: Medical-Surgical

## 2021-01-14 ENCOUNTER — Encounter: Payer: Self-pay | Admitting: Medical-Surgical

## 2021-01-14 ENCOUNTER — Telehealth (INDEPENDENT_AMBULATORY_CARE_PROVIDER_SITE_OTHER): Payer: Managed Care, Other (non HMO) | Admitting: Medical-Surgical

## 2021-01-14 VITALS — BP 121/77 | HR 73

## 2021-01-14 DIAGNOSIS — Z1329 Encounter for screening for other suspected endocrine disorder: Secondary | ICD-10-CM

## 2021-01-14 DIAGNOSIS — Z131 Encounter for screening for diabetes mellitus: Secondary | ICD-10-CM

## 2021-01-14 DIAGNOSIS — Z0000 Encounter for general adult medical examination without abnormal findings: Secondary | ICD-10-CM | POA: Diagnosis not present

## 2021-01-14 DIAGNOSIS — Z114 Encounter for screening for human immunodeficiency virus [HIV]: Secondary | ICD-10-CM

## 2021-01-14 DIAGNOSIS — Z1159 Encounter for screening for other viral diseases: Secondary | ICD-10-CM | POA: Diagnosis not present

## 2021-01-14 NOTE — Progress Notes (Signed)
Virtual Visit via Video Note  I connected with Andrew Kelly on 01/14/21 at  9:10 AM EST by a video enabled telemedicine application and verified that I am speaking with the correct person using two identifiers.   I discussed the limitations of evaluation and management by telemedicine and the availability of in person appointments. The patient expressed understanding and agreed to proceed.  Patient location: home Provider locations: office  Subjective:    CC: virtual physical per patient request  HPI: Pleasant 35 year old male presenting today to transfer care to a new PCP and complete a physical for employment.  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  HENT:  Negative for congestion, ear discharge, ear pain, hearing loss, nosebleeds, sinus pain and sore throat.   Respiratory:  Negative for cough, shortness of breath and wheezing.   Cardiovascular:  Negative for chest pain, palpitations and leg swelling.  Gastrointestinal:  Negative for abdominal pain, constipation, diarrhea, heartburn, nausea and vomiting.  Genitourinary:  Negative for dysuria, frequency and urgency.  Skin:  Negative for itching and rash.  Neurological:  Negative for dizziness, seizures, weakness and headaches.  Endo/Heme/Allergies:  Negative for polydipsia. Does not bruise/bleed easily.  Psychiatric/Behavioral:  Negative for depression and suicidal ideas. The patient is not nervous/anxious and does not have insomnia.    Past medical history, Surgical history, Family history not pertinant except as noted below, Social history, Allergies, and medications have been entered into the medical record, reviewed, and corrections made.   Review of  Systems: See HPI for pertinent positives and negatives.   Objective:    Physical Exam Vitals and nursing note reviewed.  Constitutional:      General: He is not in acute distress.    Appearance: Normal appearance.  HENT:     Head: Normocephalic and atraumatic.  Pulmonary:     Effort: Pulmonary effort is normal. No respiratory distress.  Neurological:     Mental Status: He is alert and oriented to person, place, and time.  Psychiatric:        Mood and Affect: Mood normal.        Behavior: Behavior normal.        Thought Content: Thought content normal.        Judgment: Judgment normal.   Impression and Recommendations:    1. Annual physical exam Checking CBC, CMP, and lipid panel today.  Wellness information provided with AVS.  Up-to-date on other preventative care. - Lipid panel - COMPLETE METABOLIC PANEL WITH GFR - CBC with Differential/Platelet  2. Thyroid disorder screen Checking TSH. - TSH  3. Diabetes mellitus screening Checking hemoglobin A1c. - Hemoglobin A1c  4. Need for hepatitis C screening test 5. Screening for HIV (human immunodeficiency virus) Discuss screening recommendations.  Patient has no concerns at this time and would like to defer screening.  I discussed the assessment and treatment plan with the patient. The patient was provided an opportunity to ask questions and all were answered. The patient agreed with the plan and demonstrated an understanding of the instructions.   The patient was advised to call back or seek an in-person evaluation if the symptoms worsen or if the condition fails to improve as anticipated.  25 minutes of non-face-to-face time was provided during this encounter.  Return  in about 1 year (around 01/14/2022) for annual physical exam or sooner if needed.  Thayer Ohm, DNP, APRN, FNP-BC [facility] and Sports Medicine

## 2021-01-14 NOTE — Patient Instructions (Signed)
Preventive Care 21-35 Years Old, Male ?Preventive care refers to lifestyle choices and visits with your health care provider that can promote health and wellness. Preventive care visits are also called wellness exams. ?What can I expect for my preventive care visit? ?Counseling ?During your preventive care visit, your health care provider may ask about your: ?Medical history, including: ?Past medical problems. ?Family medical history. ?Current health, including: ?Emotional well-being. ?Home life and relationship well-being. ?Sexual activity. ?Lifestyle, including: ?Alcohol, nicotine or tobacco, and drug use. ?Access to firearms. ?Diet, exercise, and sleep habits. ?Safety issues such as seatbelt and bike helmet use. ?Sunscreen use. ?Work and work environment. ?Physical exam ?Your health care provider may check your: ?Height and weight. These may be used to calculate your BMI (body mass index). BMI is a measurement that tells if you are at a healthy weight. ?Waist circumference. This measures the distance around your waistline. This measurement also tells if you are at a healthy weight and may help predict your risk of certain diseases, such as type 2 diabetes and high blood pressure. ?Heart rate and blood pressure. ?Body temperature. ?Skin for abnormal spots. ?What immunizations do I need? ?Vaccines are usually given at various ages, according to a schedule. Your health care provider will recommend vaccines for you based on your age, medical history, and lifestyle or other factors, such as travel or where you work. ?What tests do I need? ?Screening ?Your health care provider may recommend screening tests for certain conditions. This may include: ?Lipid and cholesterol levels. ?Diabetes screening. This is done by checking your blood sugar (glucose) after you have not eaten for a while (fasting). ?Hepatitis B test. ?Hepatitis C test. ?HIV (human immunodeficiency virus) test. ?STI (sexually transmitted infection)  testing, if you are at risk. ?Talk with your health care provider about your test results, treatment options, and if necessary, the need for more tests. ?Follow these instructions at home: ?Eating and drinking ? ?Eat a healthy diet that includes fresh fruits and vegetables, whole grains, lean protein, and low-fat dairy products. ?Drink enough fluid to keep your urine pale yellow. ?Take vitamin and mineral supplements as recommended by your health care provider. ?Do not drink alcohol if your health care provider tells you not to drink. ?If you drink alcohol: ?Limit how much you have to 0-2 drinks a day. ?Know how much alcohol is in your drink. In the [place] one drink equals one 12 oz bottle of beer (355 mL), one 5 oz glass of wine (148 mL), or one 1? oz glass of hard liquor (44 mL). ?Lifestyle ?Brush your teeth every morning and night with fluoride toothpaste. Floss one time each day. ?Exercise for at least 30 minutes 5 or more days each week. ?Do not use any products that contain nicotine or tobacco. These products include cigarettes, chewing tobacco, and vaping devices, such as e-cigarettes. If you need help quitting, ask your health care provider. ?Do not use drugs. ?If you are sexually active, practice safe sex. Use a condom or other form of protection to prevent STIs. ?Find healthy ways to manage stress, such as: ?Meditation, yoga, or listening to music. ?Journaling. ?Talking to a trusted person. ?Spending time with friends and family. ?Minimize exposure to UV radiation to reduce your risk of skin cancer. ?Safety ?Always wear your seat belt while driving or riding in a vehicle. ?Do not drive: ?If you have been drinking alcohol. Do not ride with someone who has been drinking. ?If you have been using any mind-altering substances or   drugs. ?While texting. ?When you are tired or distracted. ?Wear a helmet and other protective equipment during sports activities. ?If you have firearms in your house, make sure you  follow all gun safety procedures. ?Seek help if you have been physically or sexually abused. ?What's next? ?Go to your health care provider once a year for an annual wellness visit. ?Ask your health care provider how often you should have your eyes and teeth checked. ?Stay up to date on all vaccines. ?This information is not intended to replace advice given to you by your health care provider. Make sure you discuss any questions you have with your health care provider. ?Document Revised: 08/22/2020 Document Reviewed: 08/22/2020 ?Elsevier Patient Education ? 2022 Elsevier Inc. ? ?

## 2021-05-01 IMAGING — DX DG WRIST COMPLETE 3+V*L*
4 series · 4 of 4 positions shown · non-contrast
Comparison: None.

CLINICAL DATA: History of wrist surgery 15 years ago with 6 months
of worsening left wrist pain.

EXAM:
LEFT WRIST - COMPLETE 3+ VIEW

[wrist pa]
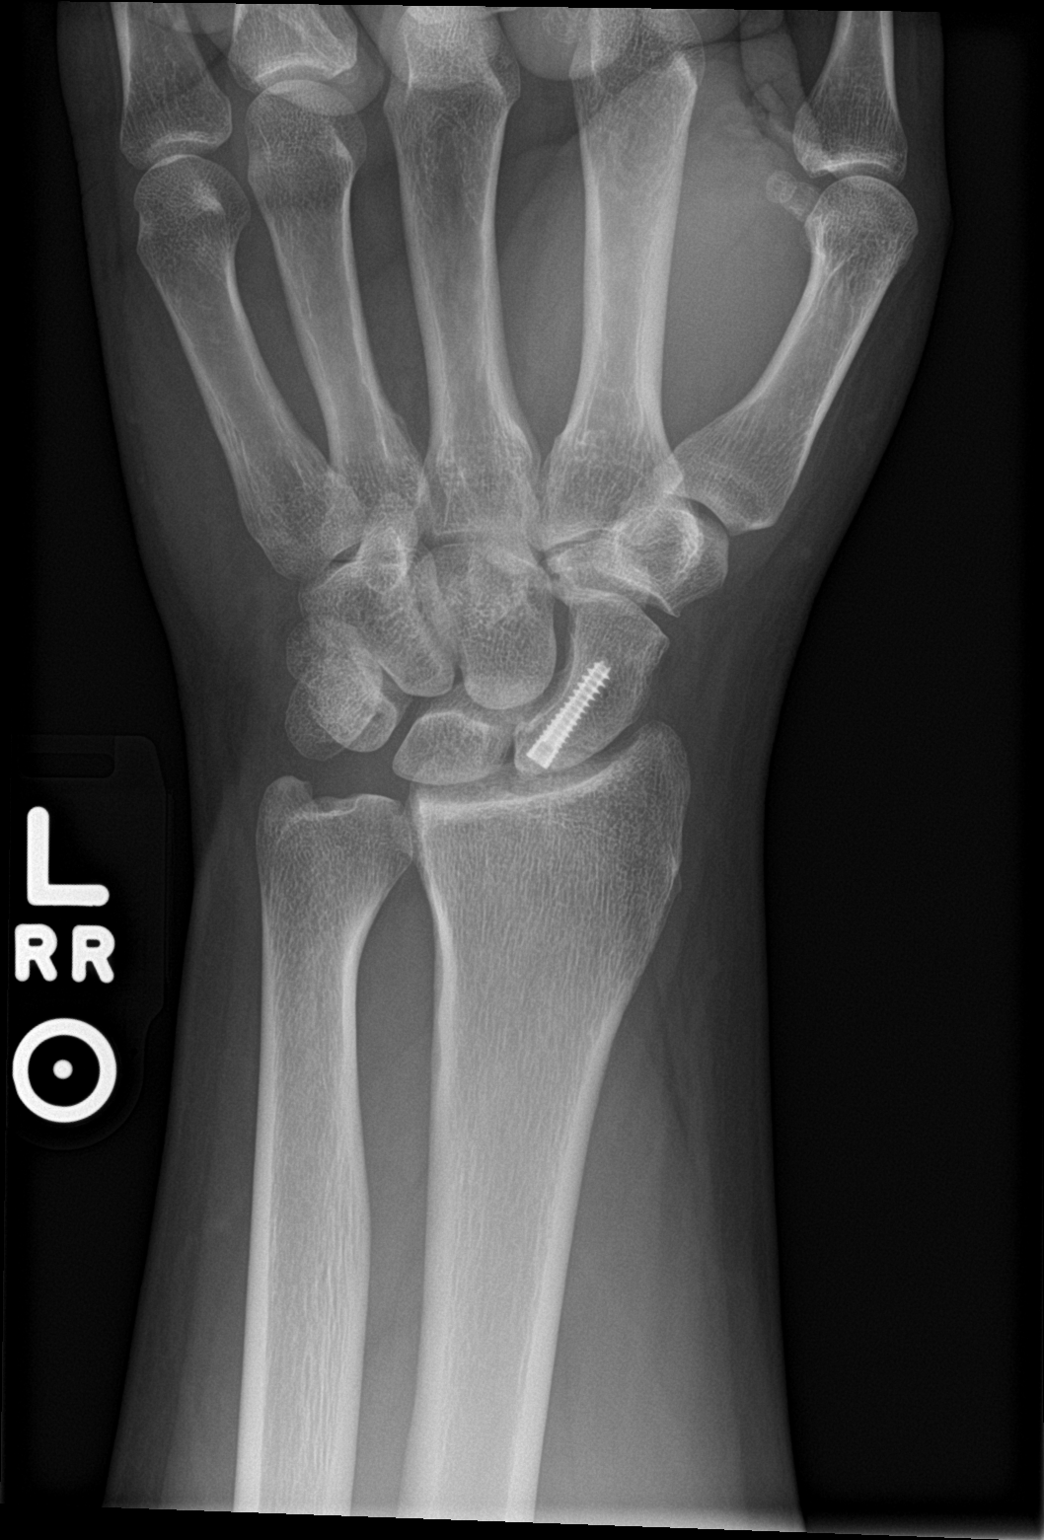

[wrist obl]
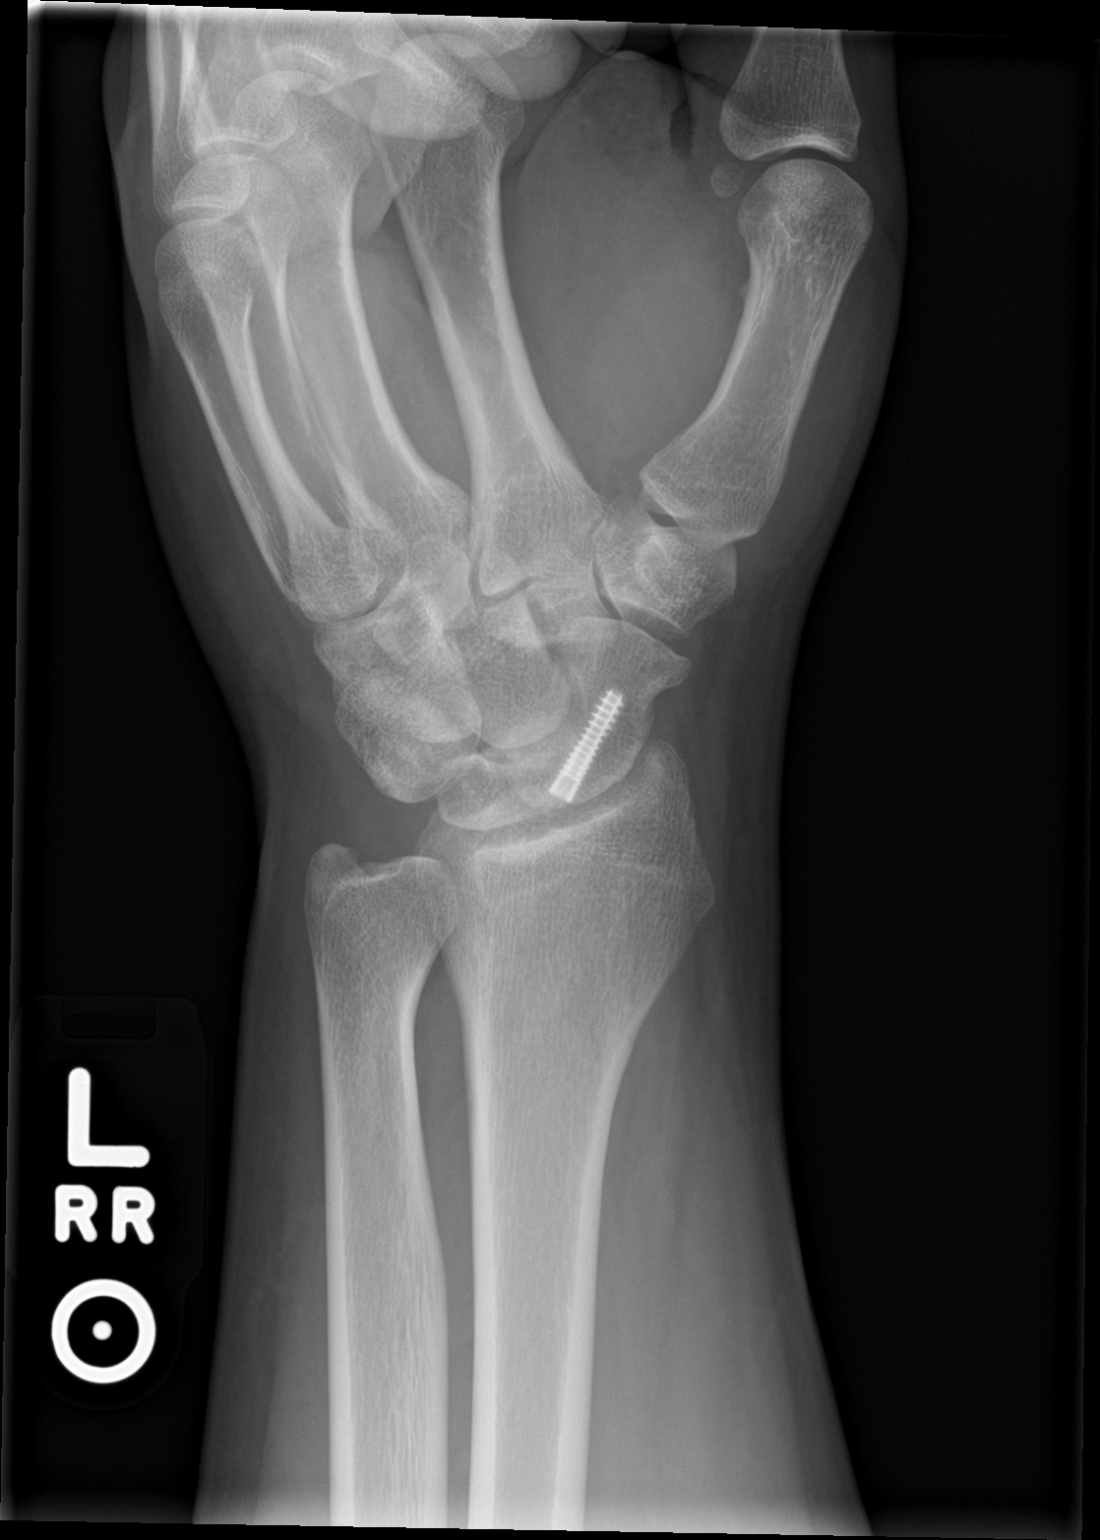

[wrist lat]
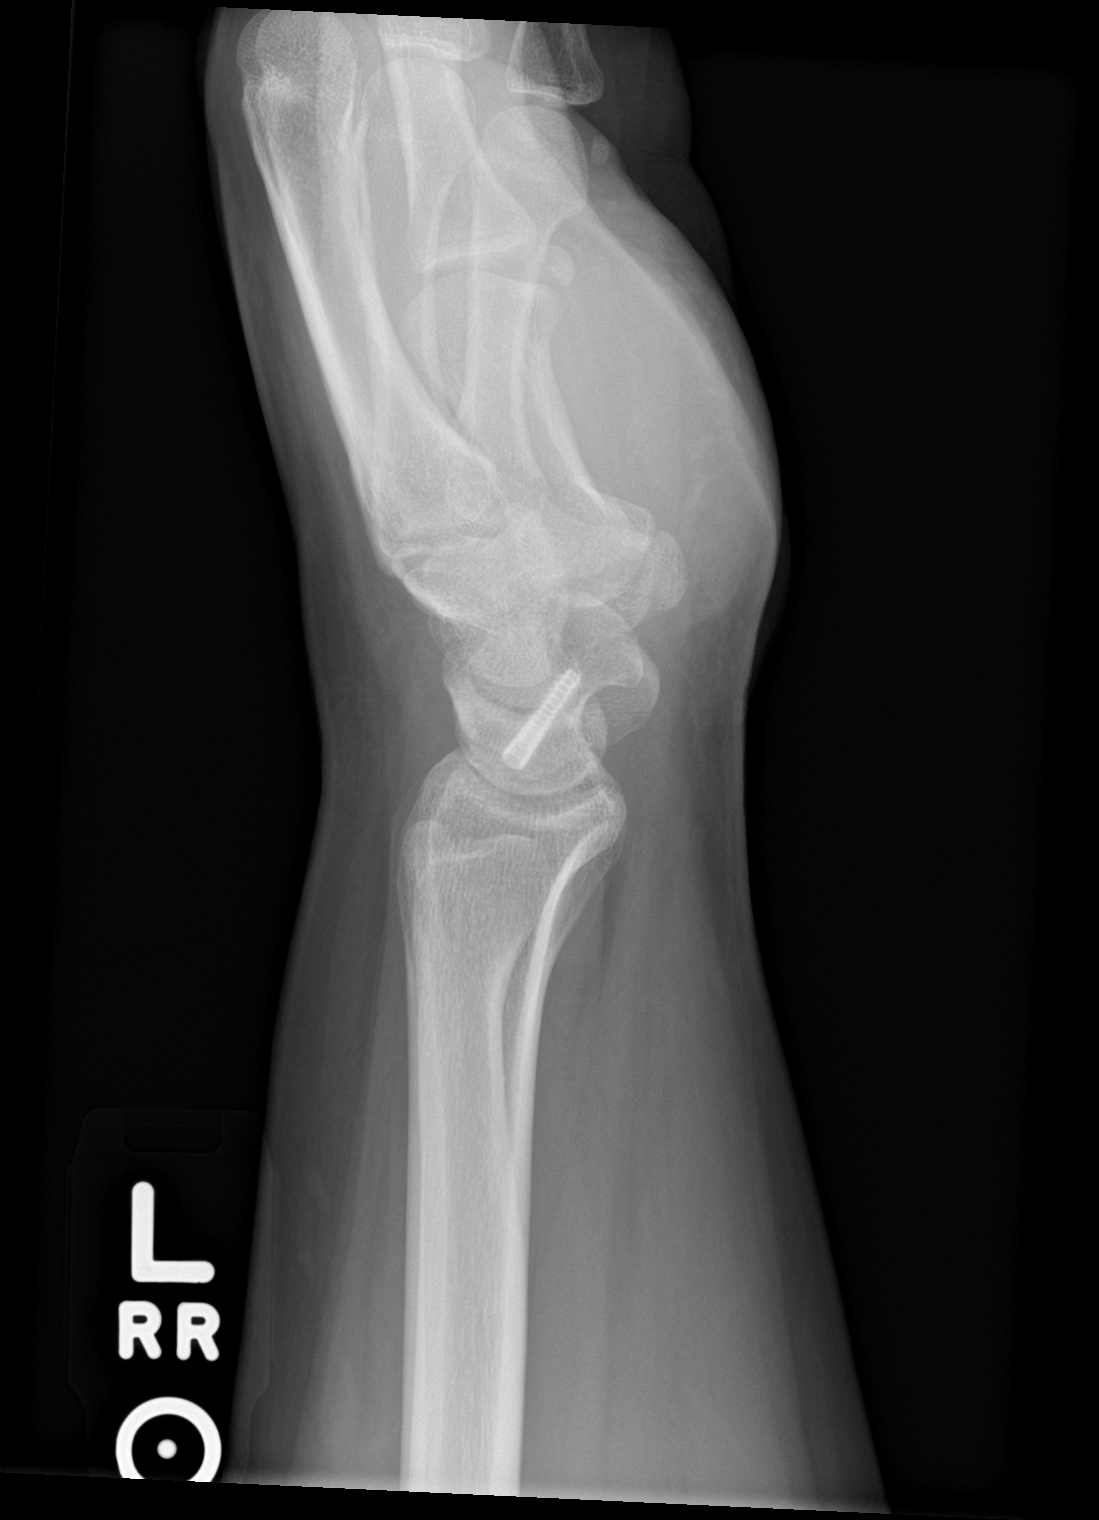

[wrist navicular]
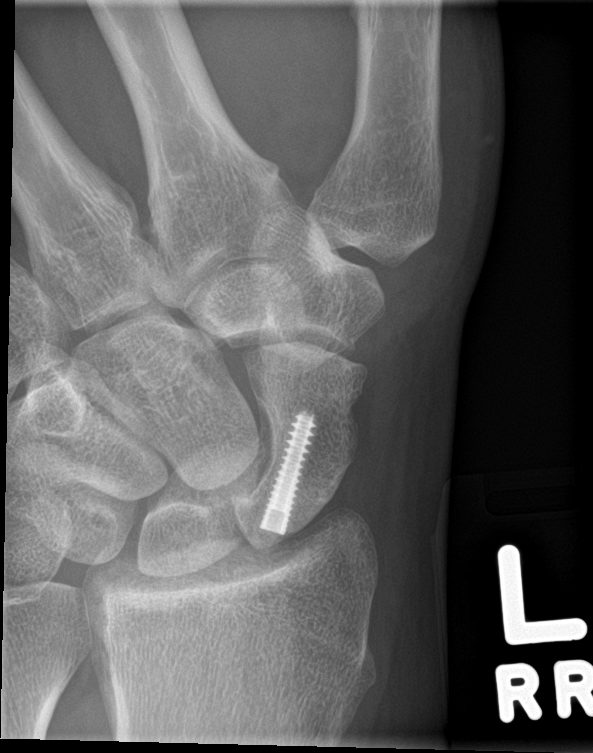

[4 of 4 positions shown; findings below may reference images not displayed]

FINDINGS: Post cancellous sella screw fixation of the scaphoid without
evidence of hardware failure or loosening. No definite evidence of
avascular necrosis.

No fracture or dislocation. Joint spaces are preserved. No erosions.
No evidence of chondrocalcinosis. Regional soft tissues appear
normal.
IMPRESSION: 1. No definite explanation for patient's worsening wrist pain.
2. Post ORIF of the scaphoid without evidence of hardware failure or
loosening. No evidence of avascular necrosis.

## 2023-01-21 ENCOUNTER — Encounter: Payer: Self-pay | Admitting: Family Medicine

## 2023-01-21 ENCOUNTER — Ambulatory Visit (INDEPENDENT_AMBULATORY_CARE_PROVIDER_SITE_OTHER): Payer: Managed Care, Other (non HMO) | Admitting: Family Medicine

## 2023-01-21 VITALS — BP 132/86 | HR 70 | Ht 73.0 in | Wt 297.8 lb

## 2023-01-21 DIAGNOSIS — Z23 Encounter for immunization: Secondary | ICD-10-CM

## 2023-01-21 DIAGNOSIS — Z0000 Encounter for general adult medical examination without abnormal findings: Secondary | ICD-10-CM

## 2023-01-21 HISTORY — DX: Encounter for general adult medical examination without abnormal findings: Z00.00

## 2023-01-21 NOTE — Progress Notes (Signed)
Established patient visit   Patient: Andrew Kelly   DOB: May 11, 1985   37 y.o. Male  MRN: [id number] Visit Date: 01/21/2023  Today's healthcare provider: Charlton Amor, DO   Chief Complaint  Patient presents with   New Patient (Initial Visit)    Establish Care    SUBJECTIVE    Chief Complaint  Patient presents with   New Patient (Initial Visit)    Establish Care   HPI HPI     New Patient (Initial Visit)    Additional comments: Establish Care      Last edited by Roselyn Reef, CMA on 01/21/2023  9:01 AM.       Pt presents for wellness exam. Has a form he needs filled out as well for his employer.  Mom: diabetes and htn Dad: lymphoma  Diet: well controlled. Gained weight during covid, but has been doing well with losing weight Exercise: plays ice hockey once a week   Smoking: none Alcohol: none   Review of Systems  Constitutional:  Negative for activity change, fatigue and fever.  Respiratory:  Negative for cough and shortness of breath.   Cardiovascular:  Negative for chest pain.  Gastrointestinal:  Negative for abdominal pain.  Genitourinary:  Negative for difficulty urinating.       No outpatient medications have been marked as taking for the 01/21/23 encounter (Office Visit) with Charlton Amor, DO.    OBJECTIVE    BP 132/86 (BP Location: Left Arm, Patient Position: Sitting, Cuff Size: Large)   Pulse 70   Ht 6\' 1"  (1.854 m)   Wt 297 lb 12 oz (135.1 kg)   SpO2 100%   BMI 39.28 kg/m   Physical Exam Vitals and nursing note reviewed.  Constitutional:      General: He is not in acute distress.    Appearance: Normal appearance.  HENT:     Head: Normocephalic and atraumatic.     Right Ear: External ear normal.     Left Ear: External ear normal.     Nose: Nose normal.  Eyes:     Conjunctiva/sclera: Conjunctivae normal.  Cardiovascular:     Rate and Rhythm: Normal rate and regular rhythm.  Pulmonary:     Effort: Pulmonary effort is  normal.     Breath sounds: Normal breath sounds.  Abdominal:     General: Abdomen is flat. Bowel sounds are normal.     Palpations: Abdomen is soft.     Tenderness: There is no abdominal tenderness.  Neurological:     General: No focal deficit present.     Mental Status: He is alert and oriented to person, place, and time.  Psychiatric:        Mood and Affect: Mood normal.        Behavior: Behavior normal.        Thought Content: Thought content normal.        Judgment: Judgment normal.        ASSESSMENT & PLAN    Problem List Items Addressed This Visit       Other   Routine adult health maintenance - Primary    Pt presents for routine wellness visit today. Due for blood work       Relevant Orders   CMP14+EGFR   Lipid panel   CBC with Differential   Other Visit Diagnoses     Encounter for immunization       Relevant Orders   Flu vaccine trivalent PF, 6mos  and older(Flulaval,Afluria,Fluarix,Fluzone) (Completed)   Pfizer Comirnaty Covid-19 Vaccine 30yrs & older (Completed)       No follow-ups on file.      No orders of the defined types were placed in this encounter.   Orders Placed This Encounter  Procedures   Flu vaccine trivalent PF, 6mos and older(Flulaval,Afluria,Fluarix,Fluzone)   Pfizer Comirnaty Covid-19 Vaccine 74yrs & older   CMP14+EGFR    Order Specific Question:   Has the patient fasted?    Answer:   No   Lipid panel    Order Specific Question:   Has the patient fasted?    Answer:   No    Order Specific Question:   Release to patient    Answer:   Immediate   CBC with Differential     Charlton Amor, DO  [facility] Primary [facility] & Sports Medicine at [facility] [phone] (phone) [phone] (fax)  [facility]

## 2023-01-21 NOTE — Assessment & Plan Note (Signed)
Pt presents for routine wellness visit today. Due for blood work

## 2023-01-22 LAB — LIPID PANEL
Chol/HDL Ratio: 4.6 ratio (ref 0.0–5.0)
Cholesterol, Total: 164 mg/dL (ref 100–199)
HDL: 36 mg/dL — ABNORMAL LOW (ref >39)
LDL Chol Calc (NIH): 91 mg/dL (ref 0–99)
Triglycerides: 218 mg/dL — ABNORMAL HIGH (ref 0–149)
VLDL Cholesterol Cal: 37 mg/dL (ref 5–40)

## 2023-01-22 LAB — CBC WITH DIFFERENTIAL/PLATELET
Basophils Absolute: 0 10*3/uL (ref 0.0–0.2)
Basos: 0 %
EOS (ABSOLUTE): 0.2 10*3/uL (ref 0.0–0.4)
Eos: 3 %
Hematocrit: 51.4 % — ABNORMAL HIGH (ref 37.5–51.0)
Hemoglobin: 16.2 g/dL (ref 13.0–17.7)
Immature Grans (Abs): 0 10*3/uL (ref 0.0–0.1)
Immature Granulocytes: 0 %
Lymphocytes Absolute: 2.7 10*3/uL (ref 0.7–3.1)
Lymphs: 40 %
MCH: 27.3 pg (ref 26.6–33.0)
MCHC: 31.5 g/dL (ref 31.5–35.7)
MCV: 87 fL (ref 79–97)
Monocytes Absolute: 0.5 10*3/uL (ref 0.1–0.9)
Monocytes: 8 %
Neutrophils Absolute: 3.3 10*3/uL (ref 1.4–7.0)
Neutrophils: 49 %
Platelets: 275 10*3/uL (ref 150–450)
RBC: 5.94 x10E6/uL — ABNORMAL HIGH (ref 4.14–5.80)
RDW: 12.6 % (ref 11.6–15.4)
WBC: 6.7 10*3/uL (ref 3.4–10.8)

## 2023-01-22 LAB — CMP14+EGFR
ALT: 15 [IU]/L (ref 0–44)
AST: 20 [IU]/L (ref 0–40)
Albumin: 4.6 g/dL (ref 4.1–5.1)
Alkaline Phosphatase: 79 [IU]/L (ref 44–121)
BUN/Creatinine Ratio: 12 (ref 9–20)
BUN: 13 mg/dL (ref 6–20)
Bilirubin Total: 0.7 mg/dL (ref 0.0–1.2)
CO2: 24 mmol/L (ref 20–29)
Calcium: 9.8 mg/dL (ref 8.7–10.2)
Chloride: 102 mmol/L (ref 96–106)
Creatinine, Ser: 1.08 mg/dL (ref 0.76–1.27)
Globulin, Total: 3 g/dL (ref 1.5–4.5)
Glucose: 76 mg/dL (ref 70–99)
Potassium: 4.8 mmol/L (ref 3.5–5.2)
Sodium: 140 mmol/L (ref 134–144)
Total Protein: 7.6 g/dL (ref 6.0–8.5)
eGFR: 91 mL/min/{1.73_m2} (ref >59)

## 2023-04-21 ENCOUNTER — Encounter (INDEPENDENT_AMBULATORY_CARE_PROVIDER_SITE_OTHER): Payer: Self-pay | Admitting: Family Medicine

## 2023-04-21 ENCOUNTER — Other Ambulatory Visit: Payer: Self-pay | Admitting: Family Medicine

## 2023-04-21 DIAGNOSIS — Z0000 Encounter for general adult medical examination without abnormal findings: Secondary | ICD-10-CM

## 2023-04-21 DIAGNOSIS — Z3169 Encounter for other general counseling and advice on procreation: Secondary | ICD-10-CM

## 2023-05-05 LAB — HEPATITIS B SURFACE ANTIBODY,QUALITATIVE: Hep B Surface Ab, Qual: REACTIVE

## 2023-05-05 LAB — HIV ANTIBODY (ROUTINE TESTING W REFLEX): HIV Screen 4th Generation wRfx: NONREACTIVE

## 2023-05-05 LAB — RPR: RPR Ser Ql: NONREACTIVE

## 2023-05-05 LAB — HEPATITIS C ANTIBODY: Hep C Virus Ab: NONREACTIVE

## 2023-05-06 LAB — CHLAMYDIA/GONOCOCCUS/TRICHOMONAS, NAA
Chlamydia by NAA: NEGATIVE
Gonococcus by NAA: NEGATIVE
Trich vag by NAA: NEGATIVE

## 2023-05-07 NOTE — Telephone Encounter (Addendum)
Please see the MyChart message reply(ies) for my assessment and plan.    This patient gave consent for this Medical Advice Message and is aware that it may result in a bill to [employer], as well as the possibility of receiving a bill for a co-payment or deductible. They are an established patient, but are not seeking medical advice exclusively about a problem treated during an in person or video visit in the last seven days. I did not recommend an in person or video visit within seven days of my reply.  I spent 11 total minutes of online digital evaluation and management services in this patient-initiated request for online care.

## 2023-05-07 NOTE — Addendum Note (Signed)
 Addended by: Monica Becton on: 05/07/2023 05:14 PM   Modules accepted: Orders

## 2023-05-10 LAB — HEPATITIS B DNA, ULTRAQUANTITATIVE, PCR: HBV DNA SERPL PCR-ACNC: NOT DETECTED [IU]/mL

## 2023-07-16 ENCOUNTER — Encounter: Payer: Self-pay | Admitting: Family Medicine

## 2023-08-19 ENCOUNTER — Telehealth: Payer: Self-pay

## 2023-10-28 NOTE — Telephone Encounter (Signed)
 [facility] requesting coding information.
